# Patient Record
Sex: Female | Born: 1995 | Race: Black or African American | Hispanic: No | Marital: Single
Health system: Southern US, Community
[De-identification: ages and names within clinical notes are randomized; demographics above are authoritative.]

---

## 2021-06-04 ENCOUNTER — Emergency Department (HOSPITAL_COMMUNITY): Payer: Self-pay

## 2021-06-04 ENCOUNTER — Emergency Department (HOSPITAL_COMMUNITY)
Admission: EM | Admit: 2021-06-04 | Discharge: 2021-06-05 | Disposition: A | Discharge status: Home / Self Care | Payer: Self-pay | Attending: Emergency Medicine | Admitting: Emergency Medicine

## 2021-06-04 ENCOUNTER — Other Ambulatory Visit: Payer: Self-pay

## 2021-06-04 DIAGNOSIS — U071 COVID-19: Secondary | ICD-10-CM | POA: Insufficient documentation

## 2021-06-04 DIAGNOSIS — R202 Paresthesia of skin: Secondary | ICD-10-CM | POA: Insufficient documentation

## 2021-06-04 DIAGNOSIS — R519 Headache, unspecified: Secondary | ICD-10-CM

## 2021-06-04 LAB — CBC WITH DIFFERENTIAL/PLATELET
Abs Immature Granulocytes: 0.02 10*3/uL (ref 0.00–0.07)
Basophils Absolute: 0 10*3/uL (ref 0.0–0.1)
Basophils Relative: 0 %
Eosinophils Absolute: 0.1 10*3/uL (ref 0.0–0.5)
Eosinophils Relative: 2 %
HCT: 39.7 % (ref 36.0–46.0)
Hemoglobin: 12.4 g/dL (ref 12.0–15.0)
Immature Granulocytes: 0 %
Lymphocytes Relative: 10 %
Lymphs Abs: 0.6 10*3/uL — ABNORMAL LOW (ref 0.7–4.0)
MCH: 21.3 pg — ABNORMAL LOW (ref 26.0–34.0)
MCHC: 31.2 g/dL (ref 30.0–36.0)
MCV: 68.2 fL — ABNORMAL LOW (ref 80.0–100.0)
Monocytes Absolute: 0.7 10*3/uL (ref 0.1–1.0)
Monocytes Relative: 12 %
Neutro Abs: 4.5 10*3/uL (ref 1.7–7.7)
Neutrophils Relative %: 76 %
Platelets: 261 10*3/uL (ref 150–400)
RBC: 5.82 MIL/uL — ABNORMAL HIGH (ref 3.87–5.11)
RDW: 18.6 % — ABNORMAL HIGH (ref 11.5–15.5)
Smear Review: NORMAL
WBC: 5.9 10*3/uL (ref 4.0–10.5)
nRBC: 0 % (ref 0.0–0.2)

## 2021-06-04 LAB — I-STAT BETA HCG BLOOD, ED (MC, WL, AP ONLY): I-stat hCG, quantitative: <5 m[IU]/mL (ref <5)

## 2021-06-04 LAB — BASIC METABOLIC PANEL
Anion gap: 7 (ref 5–15)
BUN: 8 mg/dL (ref 6–20)
CO2: 27 mmol/L (ref 22–32)
Calcium: 9 mg/dL (ref 8.9–10.3)
Chloride: 99 mmol/L (ref 98–111)
Creatinine, Ser: 0.69 mg/dL (ref 0.44–1.00)
GFR, Estimated: >60 mL/min (ref >60)
Glucose, Bld: 94 mg/dL (ref 70–99)
Potassium: 3.7 mmol/L (ref 3.5–5.1)
Sodium: 133 mmol/L — ABNORMAL LOW (ref 135–145)

## 2021-06-04 MED ORDER — DIPHENHYDRAMINE HCL 50 MG/ML IJ SOLN
25.0000 mg | Freq: Once | INTRAMUSCULAR | Status: AC
  Administered 2021-06-04: 25 mg via INTRAVENOUS
  Filled 2021-06-04: qty 1

## 2021-06-04 MED ORDER — METOCLOPRAMIDE HCL 5 MG/ML IJ SOLN
10.0000 mg | Freq: Once | INTRAMUSCULAR | Status: AC
  Administered 2021-06-04: 10 mg via INTRAVENOUS
  Filled 2021-06-04: qty 2

## 2021-06-04 MED ORDER — SODIUM CHLORIDE 0.9 % IV BOLUS
1000.0000 mL | Freq: Once | INTRAVENOUS | Status: AC
  Administered 2021-06-04: 1000 mL via INTRAVENOUS

## 2021-06-04 MED ORDER — KETOROLAC TROMETHAMINE 30 MG/ML IJ SOLN
30.0000 mg | Freq: Once | INTRAMUSCULAR | Status: AC
  Administered 2021-06-04: 30 mg via INTRAVENOUS
  Filled 2021-06-04: qty 1

## 2021-06-04 NOTE — ED Provider Notes (Signed)
Emergency Medicine Provider Triage Evaluation Note  Breanna Mayer , a 25 y.o. female  was evaluated in triage.  Pt complains of headache x 2 days.  States she has a history of Chiari malformation and was followed by doctors in [place], she has not established care with a neurosurgeon here in [place].  She states the headache for started in the left side of her head.  It felt like it is cat was scratching in her head.  She thought maybe it was allergic reaction so she tried to wash her hair but that did not help the headache.  Headache is now radiating to the right side of the her head.  She tried taking Tylenol without symptom improvement.  She states she had history of similar episode in 2020.  She also admits to bilateral leg weakness.  No personal or family history of MS.  Review of Systems  Positive: Headache, neck pain Negative: Fever, chills, lightheadedness, dizziness, weakness.  Physical Exam  BP (!) 131/93   Pulse (!) 112   Temp 100.1 F (37.8 C) (Oral)   Resp 18   SpO2 100%  Gen:   Awake, no distress   Resp:  Normal effort MSK:   Moves extremities without difficulty  Other:  Anxious and tearful in triage.  Patient alert and oriented x4.  She has subjective decreased sensation to palpation of her face and legs.  Equal grip strength in all extremities.  Speech is clear.  No facial droop.  Medical Decision Making  Medically screening exam initiated at 5:13 PM.  Appropriate orders placed.  AMORA SHEEHY was informed that the remainder of the evaluation will be completed by another provider, this initial triage assessment does not replace that evaluation, and the importance of remaining in the ED until their evaluation is complete.  [facility] labs including pregnancy test as well as head CT and cervical spine imaging ordered.   Portions of this note were generated with Scientist, clinical (histocompatibility and immunogenetics). Dictation errors may occur despite best attempts at proofreading.     Breanna Ace, PA-C 06/04/21 1718    Mancel Bale, MD 06/04/21 1800

## 2021-06-04 NOTE — ED Triage Notes (Signed)
Pt states she feels she has fluid on her brain/spine. Endorses episode in 2020, same symptoms. States symptoms started over 24hrs ago. First, L HA, "like cat scratching." HA went down from ear down to shoulder, then wrapped around to R side. Denies hx of MS, called PCP, was advised to come to ED & referral to neurosurgeon. Pt tearful in triage

## 2021-06-05 LAB — RESP PANEL BY RT-PCR (FLU A&B, COVID) ARPGX2
Influenza A by PCR: NEGATIVE
Influenza B by PCR: NEGATIVE
SARS Coronavirus 2 by RT PCR: POSITIVE — AB

## 2021-06-05 MED ORDER — NIRMATRELVIR/RITONAVIR (PAXLOVID)TABLET: 3.0000 | ORAL_TABLET | Freq: Two times a day (BID) | ORAL | 0 refills | Status: AC

## 2021-06-05 NOTE — Discharge Instructions (Addendum)
Your pregnancy test was negative.  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  You should discuss this finding with your doctor.  I've put in a referral to the neurology clinic to contact you.  Take medications as prescribed.  You have tested positive for COVID-19, meaning that you were infected with the novel coronavirus and could give the virus to others.  It is vitally important that you stay home so you do not spread it to others.      Please continue isolation at home, for at least 10 days since the start of your symptoms and until you have had 24 hours with no fever (without taking a fever reducer) and with improving of symptoms.  If you have no symptoms but tested positive (or all symptoms resolve after 5 days and you have no fever) you can leave your house but continue to wear a mask around others for an additional 5 days. If you have a fever,continue to stay home until you have had 24 hours of no fever. Most cases improve 5-10 days from onset but we have seen a small number of patients who have gotten worse after the 10 days.  Please be sure to watch for worsening symptoms and remain taking the proper precautions.   Go to the nearest hospital ED for assessment if fever/cough/breathlessness are severe or illness seems like a threat to life.    The following symptoms may appear 2-14 days after exposure: Fever Cough Shortness of breath or difficulty breathing Chills Repeated shaking with chills Muscle pain Headache Sore throat New loss of taste or smell Fatigue Congestion or runny nose Nausea or vomiting Diarrhea  HOME CARE: Only take medications as instructed by your medical team. Drink plenty of fluids and get plenty of rest. A steam or ultrasonic humidifier can help if you have congestion.   GET HELP RIGHT AWAY IF YOU HAVE EMERGENCY WARNING SIGNS.  Call 911 or proceed to your closest emergency facility if: You develop worsening high fever. Trouble  breathing Bluish lips or face Persistent pain or pressure in the chest New confusion Inability to wake or stay awake You cough up blood. Your symptoms become more severe Inability to hold down food or fluids  This list is not all possible symptoms. Contact your medical provider for any symptoms that are severe or concerning to you.

## 2021-06-05 NOTE — ED Provider Notes (Signed)
[facility] EMERGENCY DEPARTMENT Provider Note     CSN: [id number] Arrival date & time: 06/04/21  1640     History    Chief Complaint  Patient presents with   Neurologic Problem      Breanna Mayer is a 25 y.o. female.   Patient presents to the emergency department with a chief complaint of headache and body aches.  She reports having the symptoms for the past 2 days.  She also reports that she has had some tingling in her lower extremities today, bilaterally.  She reports hx of Chiari malformation and is followed in [place].  She has tried taking Tylenol for her symptoms without relief.  She states that she had similar symptoms back in 2020.   The history is provided by the patient. No language interpreter was used.      No past medical history on file.   There are no problems to display for this patient.        OB History   No obstetric history on file.        No family history on file.     Home Medications Prior to Admission medications  Not on File      Allergies            Patient has no allergy information on record.   Review of Systems   Review of Systems  All other systems reviewed and are negative.   Physical Exam Updated Vital Signs BP 127/80 (BP Location: Right Arm)   Pulse (!) 105   Temp 99.9 F (37.7 C) (Oral)   Resp 16   SpO2 100%    Physical Exam Vitals and nursing note reviewed. Constitutional:      General: She is not in acute distress.    Appearance: She is well-developed. HENT:    Head: Normocephalic and atraumatic. Eyes:    Conjunctiva/sclera: Conjunctivae normal. Neck:    Comments: No neck stiffness Cardiovascular:    Rate and Rhythm: Normal rate and regular rhythm.    Heart sounds: No murmur heard. Pulmonary:    Effort: Pulmonary effort is normal. No respiratory distress.    Breath sounds: Normal breath sounds. Abdominal:    Palpations: Abdomen is soft.    Tenderness: There is no abdominal  tenderness. Musculoskeletal:    Cervical back: Neck supple.    Comments: Moves all extremities  Skin:    General: Skin is warm and dry. Neurological:    Mental Status: She is alert.    Comments: CN 3-12 intact Speech is clear Movements are goal oriented Sensation intact on my exam      ED Results / Procedures / Treatments   Labs (all labs ordered are listed, but only abnormal results are displayed)      Labs Reviewed  BASIC METABOLIC PANEL - Abnormal; Notable for the following components:      Result Value     Sodium 133 (*)      All other components within normal limits  CBC WITH DIFFERENTIAL/PLATELET - Abnormal; Notable for the following components:    RBC 5.82 (*)      MCV 68.2 (*)      MCH 21.3 (*)      RDW 18.6 (*)      Lymphs Abs 0.6 (*)      All other components within normal limits  RESP PANEL BY RT-PCR (FLU A&B, COVID) ARPGX2  I-STAT BETA HCG BLOOD, ED ([facility], [facility], [facility] ONLY)  EKG None   Radiology  Imaging Results (Last 48 hours)  CT Head Wo Contrast   Result Date: 06/04/2021 CLINICAL DATA:  Head and neck pain for 24 hours EXAM: CT HEAD WITHOUT CONTRAST CT CERVICAL SPINE WITHOUT CONTRAST TECHNIQUE: Multidetector CT imaging of the head and cervical spine was performed following the standard protocol without intravenous contrast. Multiplanar CT image reconstructions of the cervical spine were also generated. COMPARISON:  None. FINDINGS: CT HEAD FINDINGS Brain: No evidence of acute infarction, hemorrhage, hydrocephalus, extra-axial collection, visible mass lesion or mass effect. Partially empty appearance of a slightly CSF expanded sella. Cerebellar tonsils are normally position. Remaining midline structures are unremarkable. Vascular: No hyperdense vessel or unexpected calcification. Skull: No calvarial fracture or suspicious osseous lesion. No scalp swelling or hematoma. Sinuses/Orbits: Minimal nodular thickening in left maxillary sinus. No layering air-fluid levels or  pneumatized secretions. Mastoid air cells are clear. Included orbital structures are unremarkable. Other: None. CT CERVICAL SPINE FINDINGS Alignment: Mild cervical flexion noted on scout view with positional straightening and reversal the normal cervical lordosis. No evidence of traumatic listhesis. No abnormally widened, perched or jumped facets. Normal alignment of the craniocervical and atlantoaxial articulations. Skull base and vertebrae: No acute skull base fracture. No vertebral body fracture or height loss. Normal bone mineralization. No worrisome osseous lesions. Soft tissues and spinal canal: No pre or paravertebral fluid or swelling. No visible canal hematoma. No other visible CT evident abnormality the canal though evaluation is notably limited by photon starvation most pronounced in the lower cervical levels. No conspicuous cervical adenopathy. Airways are patent. Disc levels: No significant central canal or foraminal stenosis identified within the imaged levels of the spine. Upper chest: No acute abnormality in the upper chest or imaged lung apices. Other: None. IMPRESSION: 1. No acute intracranial abnormality. 2. Partially empty appearance of a CSF expanded sella, nonspecific though can be seen in the setting of idiopathic intracranial hypertension. Correlate with exam findings and further clinical evaluation as warranted. 3. No acute cervical spine fracture or traumatic listhesis. Electronically Signed   By: Kreg Shropshire M.D.   On: 06/04/2021 19:34   CT Cervical Spine Wo Contrast   Result Date: 06/04/2021 CLINICAL DATA:  Head and neck pain for 24 hours EXAM: CT HEAD WITHOUT CONTRAST CT CERVICAL SPINE WITHOUT CONTRAST TECHNIQUE: Multidetector CT imaging of the head and cervical spine was performed following the standard protocol without intravenous contrast. Multiplanar CT image reconstructions of the cervical spine were also generated. COMPARISON:  None. FINDINGS: CT HEAD FINDINGS Brain: No  evidence of acute infarction, hemorrhage, hydrocephalus, extra-axial collection, visible mass lesion or mass effect. Partially empty appearance of a slightly CSF expanded sella. Cerebellar tonsils are normally position. Remaining midline structures are unremarkable. Vascular: No hyperdense vessel or unexpected calcification. Skull: No calvarial fracture or suspicious osseous lesion. No scalp swelling or hematoma. Sinuses/Orbits: Minimal nodular thickening in left maxillary sinus. No layering air-fluid levels or pneumatized secretions. Mastoid air cells are clear. Included orbital structures are unremarkable. Other: None. CT CERVICAL SPINE FINDINGS Alignment: Mild cervical flexion noted on scout view with positional straightening and reversal the normal cervical lordosis. No evidence of traumatic listhesis. No abnormally widened, perched or jumped facets. Normal alignment of the craniocervical and atlantoaxial articulations. Skull base and vertebrae: No acute skull base fracture. No vertebral body fracture or height loss. Normal bone mineralization. No worrisome osseous lesions. Soft tissues and spinal canal: No pre or paravertebral fluid or swelling. No visible canal hematoma. No other visible CT evident  abnormality the canal though evaluation is notably limited by photon starvation most pronounced in the lower cervical levels. No conspicuous cervical adenopathy. Airways are patent. Disc levels: No significant central canal or foraminal stenosis identified within the imaged levels of the spine. Upper chest: No acute abnormality in the upper chest or imaged lung apices. Other: None. IMPRESSION: 1. No acute intracranial abnormality. 2. Partially empty appearance of a CSF expanded sella, nonspecific though can be seen in the setting of idiopathic intracranial hypertension. Correlate with exam findings and further clinical evaluation as warranted. 3. No acute cervical spine fracture or traumatic listhesis.  Electronically Signed   By: Kreg Shropshire M.D.   On: 06/04/2021 19:34       Procedures Procedures    Medications Ordered in ED Medications - No data to display   ED Course  I have reviewed the triage vital signs and the nursing notes.   Pertinent labs & imaging results that were available during my care of the patient were reviewed by me and considered in my medical decision making (see chart for details).   MDM Rules/Calculators/A&P                            Patient here with headache, body aches, and tingling sensation bilateral lower extremities.  She is noted to be febrile in triage.  I am suspicious of COVID.  She was seen in triage by the provider in triage, and had CT scan of her head ordered.  This showed findings that could be suggestive of idiopathic intracranial hypertension.  After headache cocktail, patient states that her headache is improved.  The tingling sensation that she described in both of her legs is resolved.  She has no objective sensory or motor deficits on my exam.  COVID test is positive.  I suspect this is the etiology of patient's symptoms.  She does not have neck stiffness.  I doubt meningitis.  Will have patient follow-up with her doctor.  We will treat with Paxlovid.  Will give neuro follow-up.  Case discussed with Dr. Eudelia Bunch, who agrees with the plan.  Final Clinical Impression(s) / ED Diagnoses Final diagnoses:  Covid 19      Rx / DC Orders ED Discharge Orders       None           Roxy Horseman, PA-C 06/05/21 0244    Nira Conn, MD 06/05/21 1810

## 2021-07-01 ENCOUNTER — Other Ambulatory Visit: Payer: Self-pay | Admitting: Neurological Surgery

## 2021-07-01 DIAGNOSIS — G935 Compression of brain: Secondary | ICD-10-CM

## 2021-07-11 ENCOUNTER — Ambulatory Visit
Admission: RE | Admit: 2021-07-11 | Discharge: 2021-07-11 | Disposition: A | Discharge status: Home / Self Care | Payer: Managed Care, Other (non HMO) | Source: Ambulatory Visit | Attending: Neurological Surgery | Admitting: Neurological Surgery

## 2021-07-11 ENCOUNTER — Other Ambulatory Visit: Payer: Self-pay

## 2021-07-11 DIAGNOSIS — G935 Compression of brain: Secondary | ICD-10-CM

## 2021-07-14 ENCOUNTER — Ambulatory Visit
Admission: RE | Admit: 2021-07-14 | Discharge: 2021-07-14 | Disposition: A | Discharge status: Home / Self Care | Payer: Managed Care, Other (non HMO) | Source: Ambulatory Visit | Attending: Neurological Surgery | Admitting: Neurological Surgery

## 2021-07-14 ENCOUNTER — Other Ambulatory Visit: Payer: Self-pay

## 2021-07-14 DIAGNOSIS — G935 Compression of brain: Secondary | ICD-10-CM

## 2021-08-31 IMAGING — CT CT HEAD W/O CM
4 series · 15 of 47 positions shown, 17 images · non-contrast
Comparison: None.

CLINICAL DATA: Head and neck pain for 24 hours

EXAM:
CT HEAD WITHOUT CONTRAST
CT CERVICAL SPINE WITHOUT CONTRAST
TECHNIQUE: Multidetector CT imaging of the head and cervical spine was
performed following the standard protocol without intravenous
contrast. Multiplanar CT image reconstructions of the cervical spine
were also generated.

[Series 3: head wo · axial · 0.44mm/px · z∈[+1208,+1328]mm · 7 of 32 slices shown, 9 images]
[im 4/32  brain]
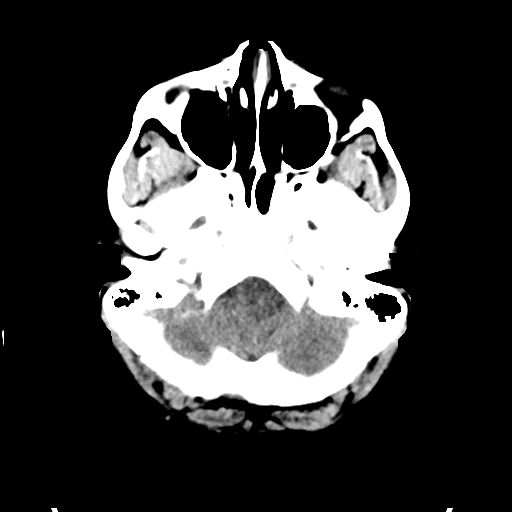
[im 4/32  bone]
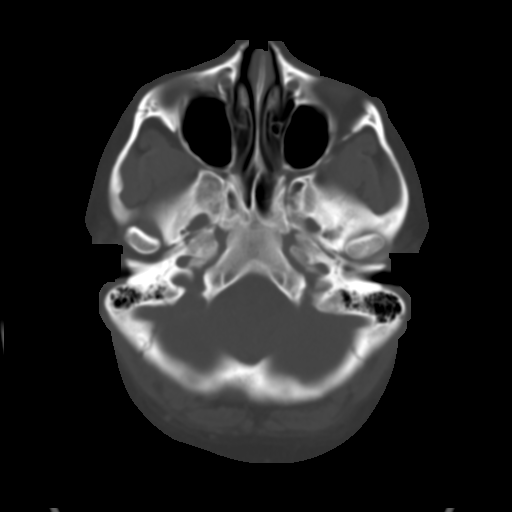
[im 8/32  brain]
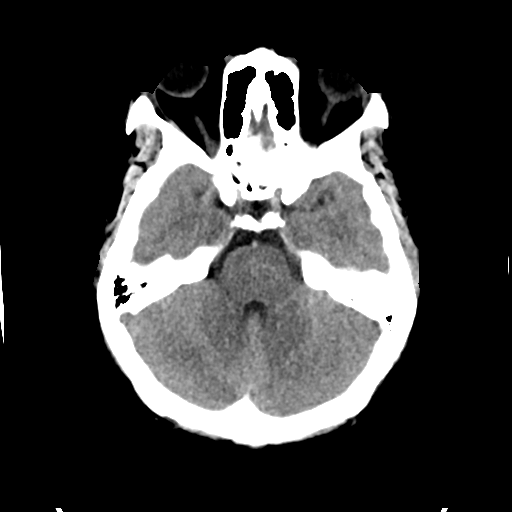
[im 12/32  brain]
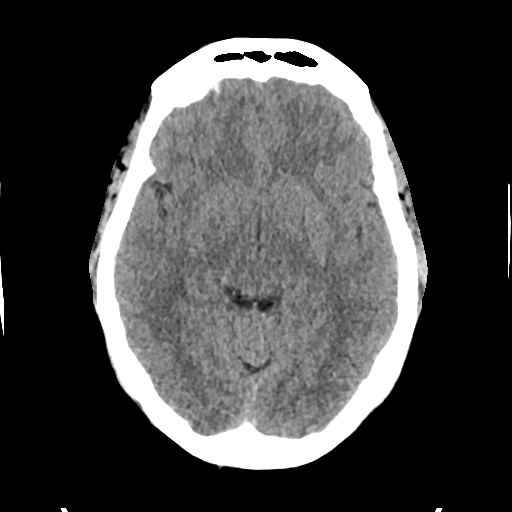
[im 16/32  brain]
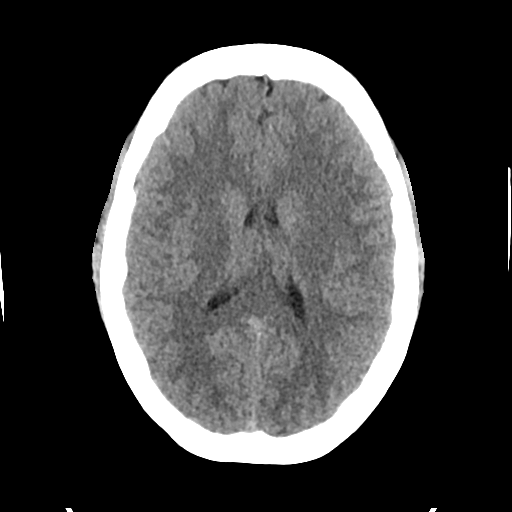
[im 20/32  brain]
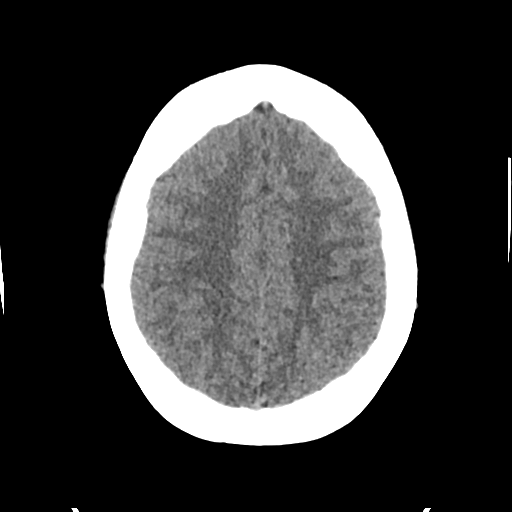
[im 20/32  bone]
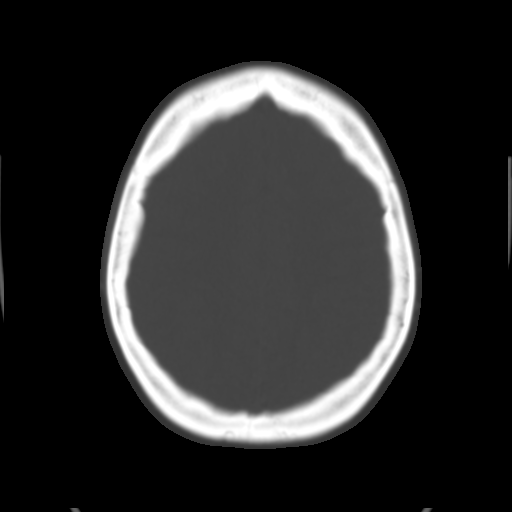
[im 24/32  brain]
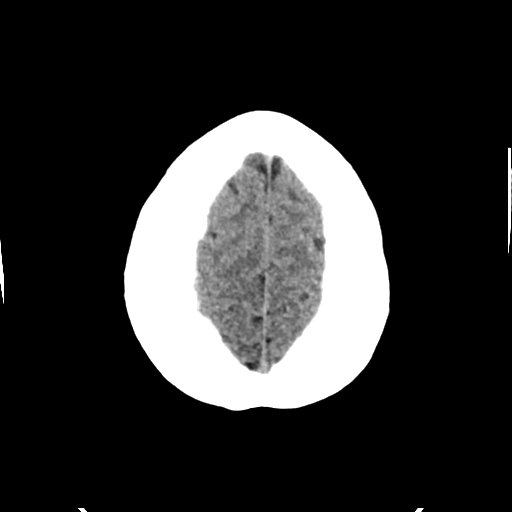
[im 28/32  brain]
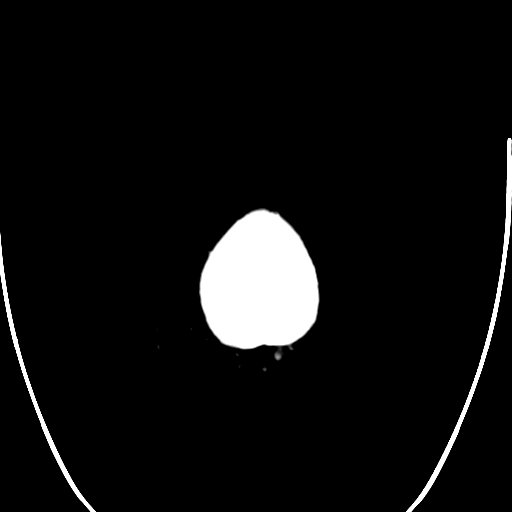

[Series 4: head bone · axial · 0.44mm/px · z∈[+1208,+1224]mm · 2 of 80 slices shown]
[im 8/80  bone]
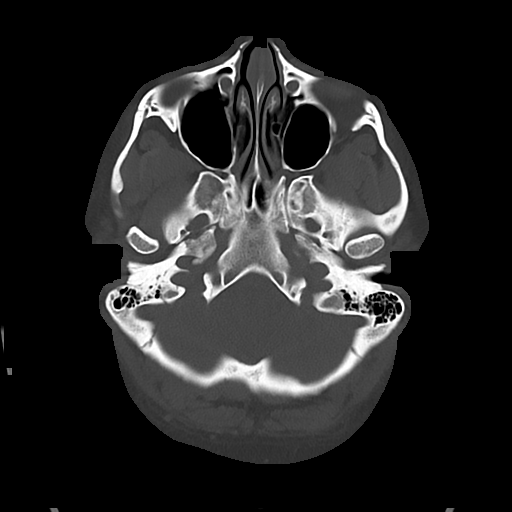
[im 16/80  bone]
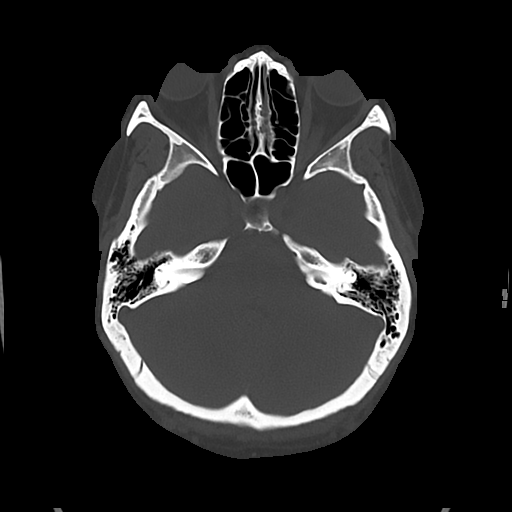

[Series 5: cor soft · coronal · 0.29mm/px · 3 of 70 slices shown]
[im 24/70  brain]
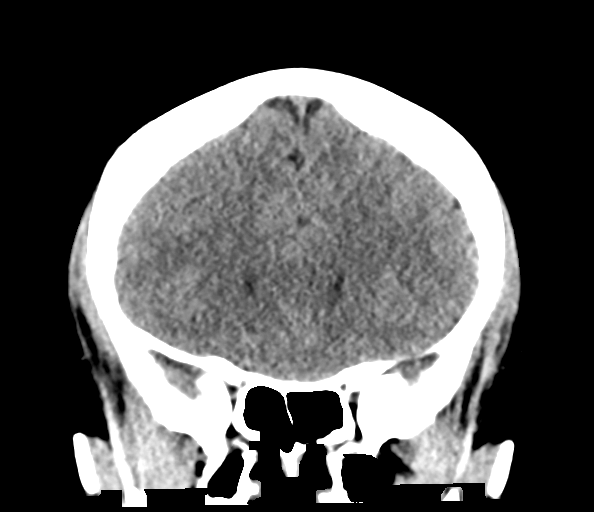
[im 31/70  brain]
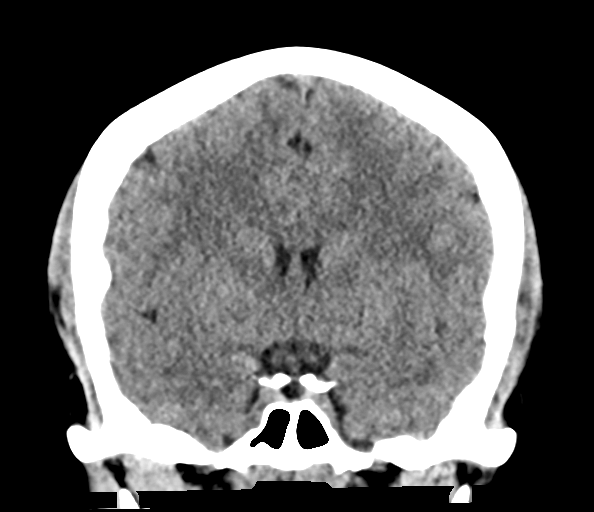
[im 39/70  brain]
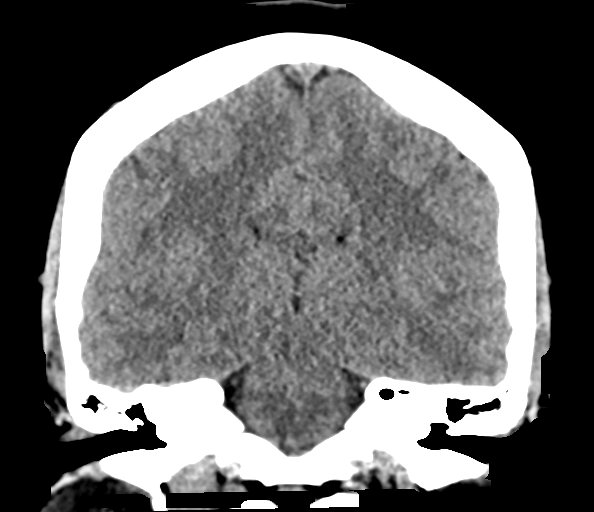

[Series 6: sag soft · sagittal · 0.29mm/px · 3 of 60 slices shown]
[im 20/60  brain]
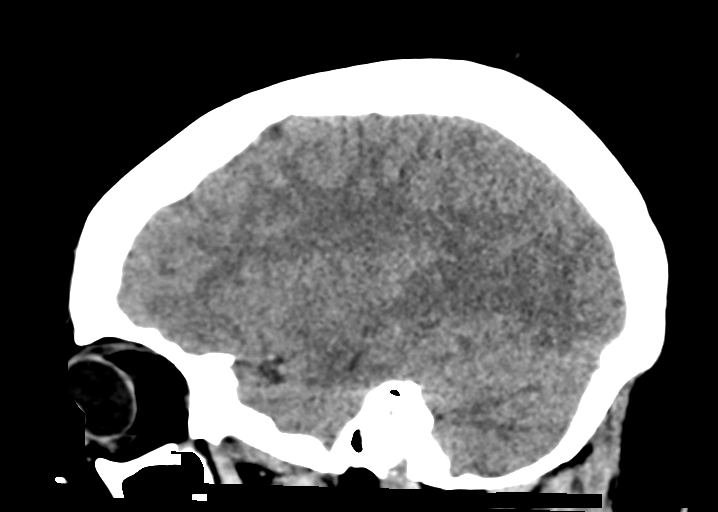
[im 30/60  brain]
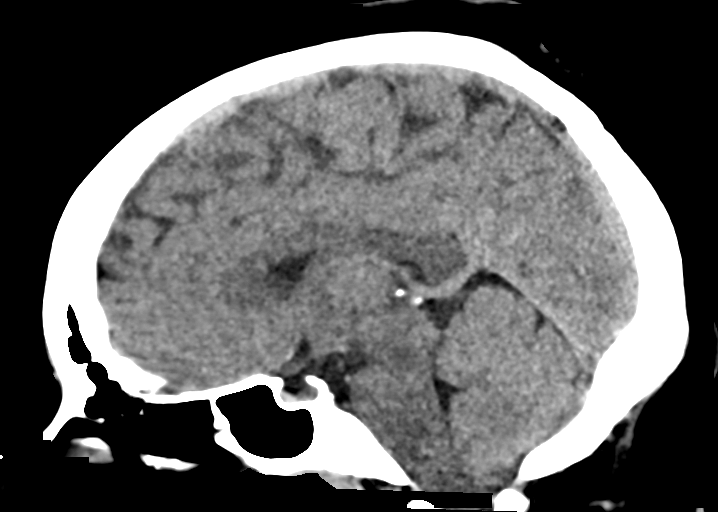
[im 40/60  brain]
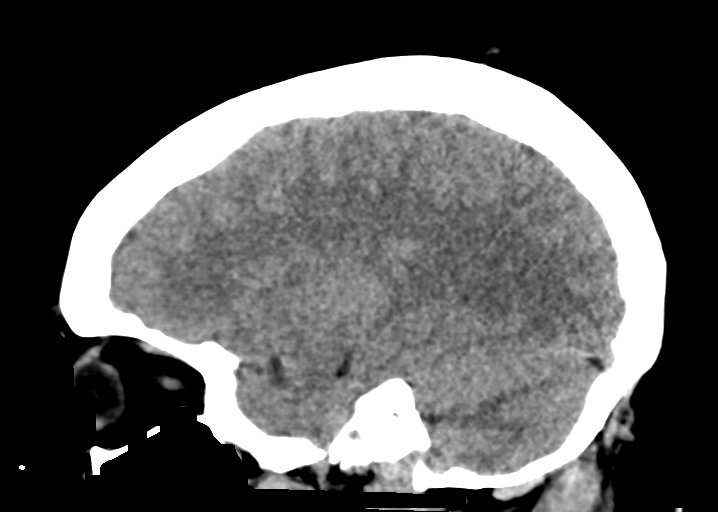

[15 of 47 positions shown; findings below may reference images not displayed]

FINDINGS: CT HEAD FINDINGS

Brain: No evidence of acute infarction, hemorrhage, hydrocephalus,
extra-axial collection, visible mass lesion or mass effect.
Partially empty appearance of a slightly CSF expanded sella.
Cerebellar tonsils are normally position. Remaining midline
structures are unremarkable.

Vascular: No hyperdense vessel or unexpected calcification.

Skull: No calvarial fracture or suspicious osseous lesion. No scalp
swelling or hematoma.

Sinuses/Orbits: Minimal nodular thickening in left maxillary sinus.
No layering air-fluid levels or pneumatized secretions. Mastoid air
cells are clear. Included orbital structures are unremarkable.

Other: None.

CT CERVICAL SPINE FINDINGS

Alignment: Mild cervical flexion noted on scout view with positional
straightening and reversal the normal cervical lordosis. No evidence
of traumatic listhesis. No abnormally widened, perched or jumped
facets. Normal alignment of the craniocervical and atlantoaxial
articulations.

Skull base and vertebrae: No acute skull base fracture. No vertebral
body fracture or height loss. Normal bone mineralization. No
worrisome osseous lesions.

Soft tissues and spinal canal: No pre or paravertebral fluid or
swelling. No visible canal hematoma. No other visible CT evident
abnormality the canal though evaluation is notably limited by photon
starvation most pronounced in the lower cervical levels. No
conspicuous cervical adenopathy. Airways are patent.

Disc levels: No significant central canal or foraminal stenosis
identified within the imaged levels of the spine.

Upper chest: No acute abnormality in the upper chest or imaged lung
apices.

Other: None.
IMPRESSION: 1. No acute intracranial abnormality.
2. Partially empty appearance of a CSF expanded sella, nonspecific
though can be seen in the setting of idiopathic intracranial
hypertension. Correlate with exam findings and further clinical
evaluation as warranted.
3. No acute cervical spine fracture or traumatic listhesis.
